# Patient Record
Sex: Female | Born: 1970 | Race: Black or African American | Hispanic: No | Smoking: Current every day smoker
Health system: Southern US, Community
[De-identification: ages and names within clinical notes are randomized; demographics above are authoritative.]

## PROBLEM LIST (undated history)

## (undated) DIAGNOSIS — E119 Type 2 diabetes mellitus without complications: Secondary | ICD-10-CM

## (undated) DIAGNOSIS — M1990 Unspecified osteoarthritis, unspecified site: Secondary | ICD-10-CM

## (undated) DIAGNOSIS — I10 Essential (primary) hypertension: Secondary | ICD-10-CM

## (undated) HISTORY — PX: ABDOMINAL HYSTERECTOMY: SHX81

---

## 1999-10-15 ENCOUNTER — Emergency Department (HOSPITAL_COMMUNITY): Admission: EM | Admit: 1999-10-15 | Discharge: 1999-10-15 | Payer: Self-pay | Admitting: *Deleted

## 2007-12-29 ENCOUNTER — Emergency Department (HOSPITAL_BASED_OUTPATIENT_CLINIC_OR_DEPARTMENT_OTHER): Admission: EM | Admit: 2007-12-29 | Discharge: 2007-12-29 | Payer: Self-pay | Admitting: Emergency Medicine

## 2010-12-12 ENCOUNTER — Emergency Department (HOSPITAL_BASED_OUTPATIENT_CLINIC_OR_DEPARTMENT_OTHER)
Admission: EM | Admit: 2010-12-12 | Discharge: 2010-12-12 | Disposition: A | Discharge status: Home / Self Care | Payer: Self-pay | Attending: Emergency Medicine | Admitting: Emergency Medicine

## 2010-12-12 ENCOUNTER — Emergency Department (INDEPENDENT_AMBULATORY_CARE_PROVIDER_SITE_OTHER): Payer: BC Managed Care – PPO

## 2010-12-12 DIAGNOSIS — R079 Chest pain, unspecified: Secondary | ICD-10-CM | POA: Insufficient documentation

## 2010-12-12 DIAGNOSIS — M4120 Other idiopathic scoliosis, site unspecified: Secondary | ICD-10-CM

## 2010-12-12 DIAGNOSIS — R1013 Epigastric pain: Secondary | ICD-10-CM

## 2010-12-12 DIAGNOSIS — D72829 Elevated white blood cell count, unspecified: Secondary | ICD-10-CM | POA: Insufficient documentation

## 2010-12-12 DIAGNOSIS — R0789 Other chest pain: Secondary | ICD-10-CM

## 2010-12-12 LAB — CK TOTAL AND CKMB (NOT AT ARMC)
CK, MB: 1 ng/mL (ref 0.3–4.0)
Relative Index: INVALID (ref 0.0–2.5)
Total CK: 81 U/L (ref 7–177)

## 2010-12-12 LAB — URINALYSIS, ROUTINE W REFLEX MICROSCOPIC
Bilirubin Urine: NEGATIVE
Glucose, UA: NEGATIVE mg/dL
Hgb urine dipstick: NEGATIVE
Ketones, ur: NEGATIVE mg/dL
Protein, ur: NEGATIVE mg/dL

## 2010-12-12 LAB — BASIC METABOLIC PANEL
Chloride: 100 mEq/L (ref 96–112)
GFR calc non Af Amer: >60 mL/min (ref >60)
Potassium: 4.1 mEq/L (ref 3.5–5.1)
Sodium: 137 mEq/L (ref 135–145)

## 2010-12-12 LAB — CBC
Platelets: 371 10*3/uL (ref 150–400)
RBC: 5.31 MIL/uL — ABNORMAL HIGH (ref 3.87–5.11)
RDW: 13.5 % (ref 11.5–15.5)
WBC: 17.1 10*3/uL — ABNORMAL HIGH (ref 4.0–10.5)

## 2010-12-12 LAB — TROPONIN I: Troponin I: <0.3 ng/mL (ref <0.30)

## 2010-12-12 LAB — DIFFERENTIAL
Basophils Absolute: 0 10*3/uL (ref 0.0–0.1)
Eosinophils Absolute: 0.2 10*3/uL (ref 0.0–0.7)
Eosinophils Relative: 1 % (ref 0–5)
Lymphocytes Relative: 19 % (ref 12–46)
Lymphs Abs: 3.3 10*3/uL (ref 0.7–4.0)
Neutrophils Relative %: 73 % (ref 43–77)

## 2010-12-12 LAB — PRO B NATRIURETIC PEPTIDE: Pro B Natriuretic peptide (BNP): 19.4 pg/mL (ref 0–125)

## 2010-12-18 ENCOUNTER — Emergency Department (INDEPENDENT_AMBULATORY_CARE_PROVIDER_SITE_OTHER): Payer: BC Managed Care – PPO

## 2010-12-18 ENCOUNTER — Emergency Department (HOSPITAL_BASED_OUTPATIENT_CLINIC_OR_DEPARTMENT_OTHER)
Admission: EM | Admit: 2010-12-18 | Discharge: 2010-12-18 | Disposition: A | Discharge status: Home / Self Care | Payer: BC Managed Care – PPO | Attending: Emergency Medicine | Admitting: Emergency Medicine

## 2010-12-18 DIAGNOSIS — R079 Chest pain, unspecified: Secondary | ICD-10-CM

## 2010-12-18 DIAGNOSIS — K219 Gastro-esophageal reflux disease without esophagitis: Secondary | ICD-10-CM | POA: Insufficient documentation

## 2010-12-18 DIAGNOSIS — D72829 Elevated white blood cell count, unspecified: Secondary | ICD-10-CM | POA: Insufficient documentation

## 2010-12-18 DIAGNOSIS — R7309 Other abnormal glucose: Secondary | ICD-10-CM | POA: Insufficient documentation

## 2010-12-18 LAB — DIFFERENTIAL
Eosinophils Absolute: 0.2 10*3/uL (ref 0.0–0.7)
Eosinophils Relative: 2 % (ref 0–5)
Lymphs Abs: 2.6 10*3/uL (ref 0.7–4.0)
Monocytes Absolute: 0.5 10*3/uL (ref 0.1–1.0)
Monocytes Relative: 4 % (ref 3–12)
Neutrophils Relative %: 72 % (ref 43–77)

## 2010-12-18 LAB — URINALYSIS, ROUTINE W REFLEX MICROSCOPIC
Glucose, UA: NEGATIVE mg/dL
Leukocytes, UA: NEGATIVE
pH: 7 (ref 5.0–8.0)

## 2010-12-18 LAB — CBC
MCH: 25.3 pg — ABNORMAL LOW (ref 26.0–34.0)
MCHC: 33.6 g/dL (ref 30.0–36.0)
MCV: 75.3 fL — ABNORMAL LOW (ref 78.0–100.0)
Platelets: 334 10*3/uL (ref 150–400)
RBC: 5.18 MIL/uL — ABNORMAL HIGH (ref 3.87–5.11)

## 2010-12-18 LAB — BASIC METABOLIC PANEL
CO2: 23 mEq/L (ref 19–32)
Chloride: 101 mEq/L (ref 96–112)
Creatinine, Ser: 0.5 mg/dL (ref 0.4–1.2)
Sodium: 136 mEq/L (ref 135–145)

## 2010-12-18 LAB — TROPONIN I: Troponin I: <0.3 ng/mL (ref <0.30)

## 2011-04-05 LAB — RAPID STREP SCREEN (MED CTR MEBANE ONLY): Streptococcus, Group A Screen (Direct): NEGATIVE

## 2011-07-04 ENCOUNTER — Emergency Department (INDEPENDENT_AMBULATORY_CARE_PROVIDER_SITE_OTHER): Payer: BC Managed Care – PPO

## 2011-07-04 ENCOUNTER — Encounter: Payer: Self-pay | Admitting: *Deleted

## 2011-07-04 ENCOUNTER — Emergency Department (HOSPITAL_BASED_OUTPATIENT_CLINIC_OR_DEPARTMENT_OTHER)
Admission: EM | Admit: 2011-07-04 | Discharge: 2011-07-04 | Disposition: A | Discharge status: Home / Self Care | Payer: BC Managed Care – PPO | Attending: Emergency Medicine | Admitting: Emergency Medicine

## 2011-07-04 DIAGNOSIS — J40 Bronchitis, not specified as acute or chronic: Secondary | ICD-10-CM | POA: Insufficient documentation

## 2011-07-04 DIAGNOSIS — Z8739 Personal history of other diseases of the musculoskeletal system and connective tissue: Secondary | ICD-10-CM | POA: Insufficient documentation

## 2011-07-04 DIAGNOSIS — R1110 Vomiting, unspecified: Secondary | ICD-10-CM | POA: Insufficient documentation

## 2011-07-04 DIAGNOSIS — F17200 Nicotine dependence, unspecified, uncomplicated: Secondary | ICD-10-CM

## 2011-07-04 DIAGNOSIS — R197 Diarrhea, unspecified: Secondary | ICD-10-CM | POA: Insufficient documentation

## 2011-07-04 DIAGNOSIS — R05 Cough: Secondary | ICD-10-CM

## 2011-07-04 HISTORY — DX: Unspecified osteoarthritis, unspecified site: M19.90

## 2011-07-04 MED ORDER — SODIUM CHLORIDE 0.9 % IV BOLUS (SEPSIS)
1000.0000 mL | Freq: Once | INTRAVENOUS | Status: AC
  Administered 2011-07-04: 1000 mL via INTRAVENOUS

## 2011-07-04 MED ORDER — ONDANSETRON HCL 4 MG PO TABS: 8.0000 mg | ORAL_TABLET | Freq: Two times a day (BID) | ORAL | Status: AC | PRN

## 2011-07-04 MED ORDER — ALBUTEROL SULFATE HFA 108 (90 BASE) MCG/ACT IN AERS
2.0000 | INHALATION_SPRAY | RESPIRATORY_TRACT | Status: DC | PRN
  Administered 2011-07-04: 2 via RESPIRATORY_TRACT
  Filled 2011-07-04: qty 6.7

## 2011-07-04 MED ORDER — ACETAMINOPHEN 500 MG PO TABS
1000.0000 mg | ORAL_TABLET | Freq: Once | ORAL | Status: AC
  Administered 2011-07-04: 1000 mg via ORAL
  Filled 2011-07-04: qty 2

## 2011-07-04 MED ORDER — HYDROCOD POLST-CHLORPHEN POLST 10-8 MG/5ML PO LQCR: 5.0000 mL | Freq: Two times a day (BID) | ORAL | Status: AC | PRN

## 2011-07-04 NOTE — ED Notes (Signed)
Pt c/o flu like SX for past month.  Pt completed Tamiflu, currently using theraflu with no relief.  Pt states N/V/D worsening today.

## 2011-07-04 NOTE — ED Provider Notes (Signed)
History     CSN: [id number]  Arrival date & time 07/04/11  1321   First MD Initiated Contact with Patient 07/04/11 1540      Chief Complaint  Patient presents with  . Influenza    (Consider location/radiation/quality/duration/timing/severity/associated sxs/prior treatment) HPI Complains of cough, nonproductive for one month . Also complains of vomiting and diarrhea onset this morning. Patient had 4 episodes diarrhea 4 episodes of vomiting. No known fever no treatment prior to coming here. Also complains of diffuse myalgias no other complaint. No other associated symptoms. Nothing makes symptoms better or worse. Cough has been unchanged no shortness of breath Past Medical History  Diagnosis Date  . Arthritis    Rheumatoid Past Surgical History  Procedure Date  . Abdominal hysterectomy     History reviewed. No pertinent family history.  History  Substance Use Topics  . Smoking status: Current Everyday Smoker -- 0.5 packs/day  . Smokeless tobacco: Not on file  . Alcohol Use: No    OB History    Grav Para Term Preterm Abortions TAB SAB Ect Mult Living                  Review of Systems  Constitutional: Positive for fatigue. Negative for fever.  HENT: Positive for postnasal drip.   Respiratory: Positive for cough.   Cardiovascular: Negative.   Gastrointestinal: Positive for vomiting and diarrhea. Negative for nausea.       No nausea at present  Musculoskeletal: Negative.   Skin: Negative.   Neurological: Negative.   Hematological: Negative.   Psychiatric/Behavioral: Negative.   All other systems reviewed and are negative.    Allergies  Penicillins  Home Medications   Current Outpatient Rx  Name Route Sig Dispense Refill  . METHOTREXATE SODIUM 10 MG PO TABS Oral Take 10 mg by mouth once a week. Caution: Chemotherapy. Protect from light.       BP 118/99  Pulse 109  Temp(Src) 98.6 F (37 C) (Oral)  Resp 18  Ht 5\' 6"  (1.676 m)  Wt 212 lb (96.163 kg)   BMI 34.22 kg/m2  SpO2 100%  Physical Exam  Nursing note and vitals reviewed. Constitutional: She appears well-developed and well-nourished. No distress.  HENT:  Head: Normocephalic and atraumatic.  Nose: Nose normal.       Mucous membranes dry  Eyes: Conjunctivae are normal. Pupils are equal, round, and reactive to light.  Neck: Neck supple. No tracheal deviation present. No thyromegaly present.  Cardiovascular: Normal rate and regular rhythm.   No murmur heard. Pulmonary/Chest: Effort normal and breath sounds normal.       Occasional cough  Abdominal: Soft. Bowel sounds are normal. She exhibits no distension. There is no tenderness.  Musculoskeletal: Normal range of motion. She exhibits no edema and no tenderness.  Neurological: She is alert. Coordination normal.  Skin: Skin is warm and dry. No rash noted.  Psychiatric: She has a normal mood and affect.    ED Course  Procedures (including critical care time) 5:45 PM feels improved after treatment with albuterol HFA and intravenous fluids Labs Reviewed - No data to display No results found. Results for orders placed during the hospital encounter of 12/18/10  DIFFERENTIAL      Component Value Range   Neutrophils Relative 72  43 - 77 (%)   Neutro Abs 8.9 (*) 1.7 - 7.7 (K/uL)   Lymphocytes Relative 22  12 - 46 (%)   Lymphs Abs 2.6  0.7 - 4.0 (K/uL)  Monocytes Relative 4  3 - 12 (%)   Monocytes Absolute 0.5  0.1 - 1.0 (K/uL)   Eosinophils Relative 2  0 - 5 (%)   Eosinophils Absolute 0.2  0.0 - 0.7 (K/uL)   Basophils Relative 0  0 - 1 (%)   Basophils Absolute 0.0  0.0 - 0.1 (K/uL)  CBC      Component Value Range   WBC 12.3 (*) 4.0 - 10.5 (K/uL)   RBC 5.18 (*) 3.87 - 5.11 (MIL/uL)   Hemoglobin 13.1  12.0 - 15.0 (g/dL)   HCT 45.4  09.8 - 11.9 (%)   MCV 75.3 (*) 78.0 - 100.0 (fL)   MCH 25.3 (*) 26.0 - 34.0 (pg)   MCHC 33.6  30.0 - 36.0 (g/dL)   RDW 14.7  82.9 - 56.2 (%)   Platelets 334  150 - 400 (K/uL)  CK TOTAL AND CKMB       Component Value Range   Total CK 101  7 - 177 (U/L)   CK, MB 1.1  0.3 - 4.0 (ng/mL)   Relative Index 1.1  0.0 - 2.5   TROPONIN I      Component Value Range   Troponin I <0.30  <0.30 (ng/mL)  BASIC METABOLIC PANEL      Component Value Range   Sodium 136  135 - 145 (mEq/L)   Potassium 3.7  3.5 - 5.1 (mEq/L)   Chloride 101  96 - 112 (mEq/L)   CO2 23  19 - 32 (mEq/L)   Glucose, Bld 181 (*) 70 - 99 (mg/dL)   BUN 7  6 - 23 (mg/dL)   Creatinine, Ser 1.30  0.4 - 1.2 (mg/dL)   Calcium 9.4  8.4 - 86.5 (mg/dL)   GFR calc non Af Amer >60  >60 (mL/min)   GFR calc Af Amer >60  >60 (mL/min)  URINALYSIS, ROUTINE W REFLEX MICROSCOPIC      Component Value Range   Color, Urine YELLOW  YELLOW    APPearance CLEAR  CLEAR    Specific Gravity, Urine 1.022  1.005 - 1.030    pH 7.0  5.0 - 8.0    Glucose, UA NEGATIVE  NEGATIVE (mg/dL)   Hgb urine dipstick NEGATIVE  NEGATIVE    Bilirubin Urine NEGATIVE  NEGATIVE    Ketones, ur NEGATIVE  NEGATIVE (mg/dL)   Protein, ur NEGATIVE  NEGATIVE (mg/dL)   Urobilinogen, UA 4.0 (*) 0.0 - 1.0 (mg/dL)   Nitrite NEGATIVE  NEGATIVE    Leukocytes, UA NEGATIVE  NEGATIVE    Dg Chest 2 View  07/04/2011  *RADIOLOGY REPORT*  Clinical Data: Cough, smoker  CHEST - 2 VIEW  Comparison: December 18, 2010  Findings: The cardiac silhouette, mediastinum, pulmonary vasculature are within normal limits.  Both lungs are clear. There is no acute bony abnormality.  IMPRESSION: There is no evidence of acute cardiac or pulmonary process.  Original Report Authenticated By: Brandon Melnick, M.D.     No diagnosis found.    MDM  Suspect bronchitis is caused cough for past month Vomiting diarrhea likely secondary to viral illness Plan albuterol HFA to go 2 puffs every 4 hours when necessary shortness of breath or cough Encourage by mouth fluid Prescription Tussionex, Zofran Followup PMD as needed Nicole Kindred #1 bronchitis #2 gastroenteritis with mild dehydration        Doug Sou, MD 07/04/11 1750

## 2011-07-04 NOTE — ED Notes (Signed)
Pt c/o flu like symptoms x 1 month

## 2012-05-16 IMAGING — CR DG CHEST 2V
2 series · 2 of 2 positions shown · non-contrast
Comparison: None.

CLINICAL DATA: 39-year-old female with left side chest pain
radiating to the left upper extremity.

CHEST - 2 VIEW

[w chest pa]
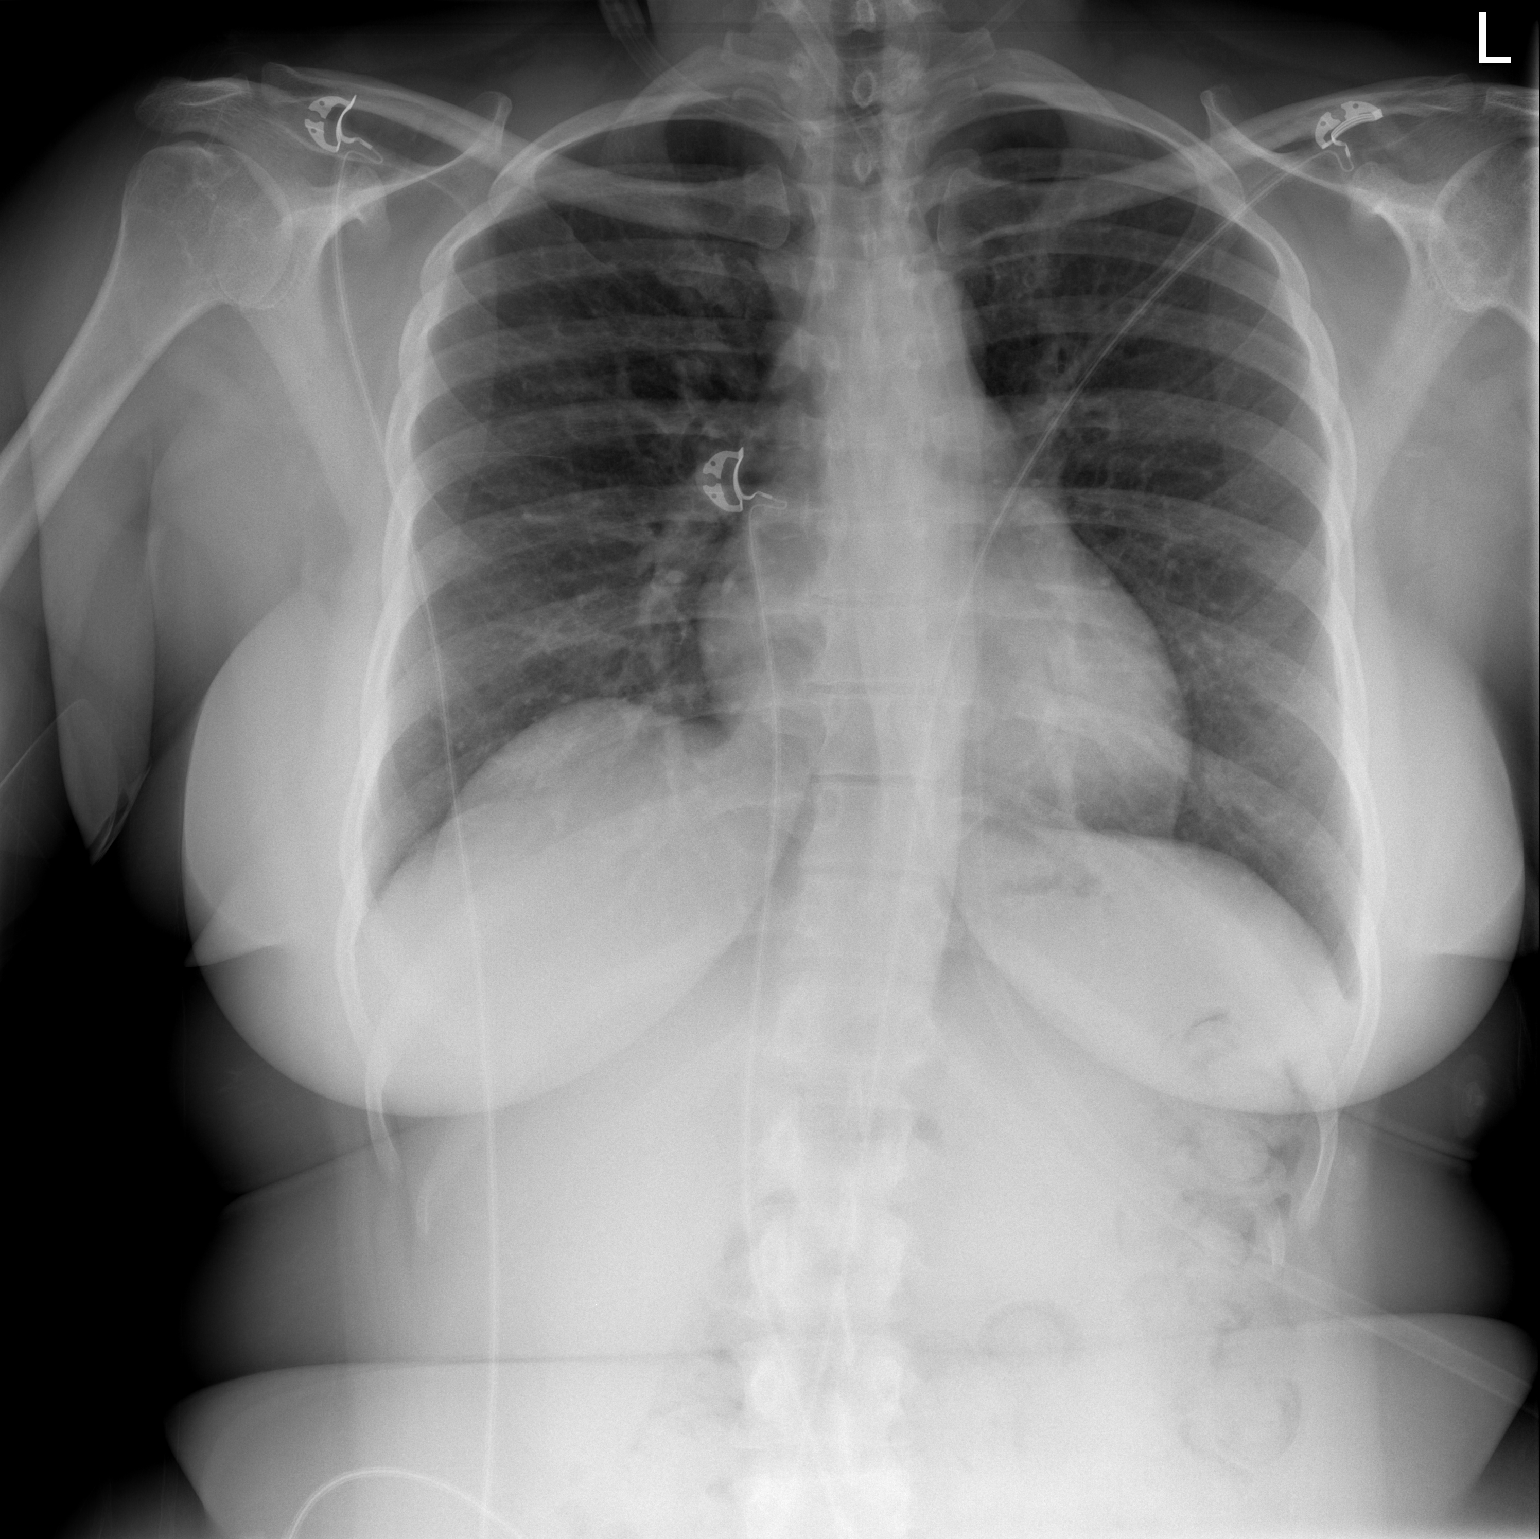

[w chest lat]
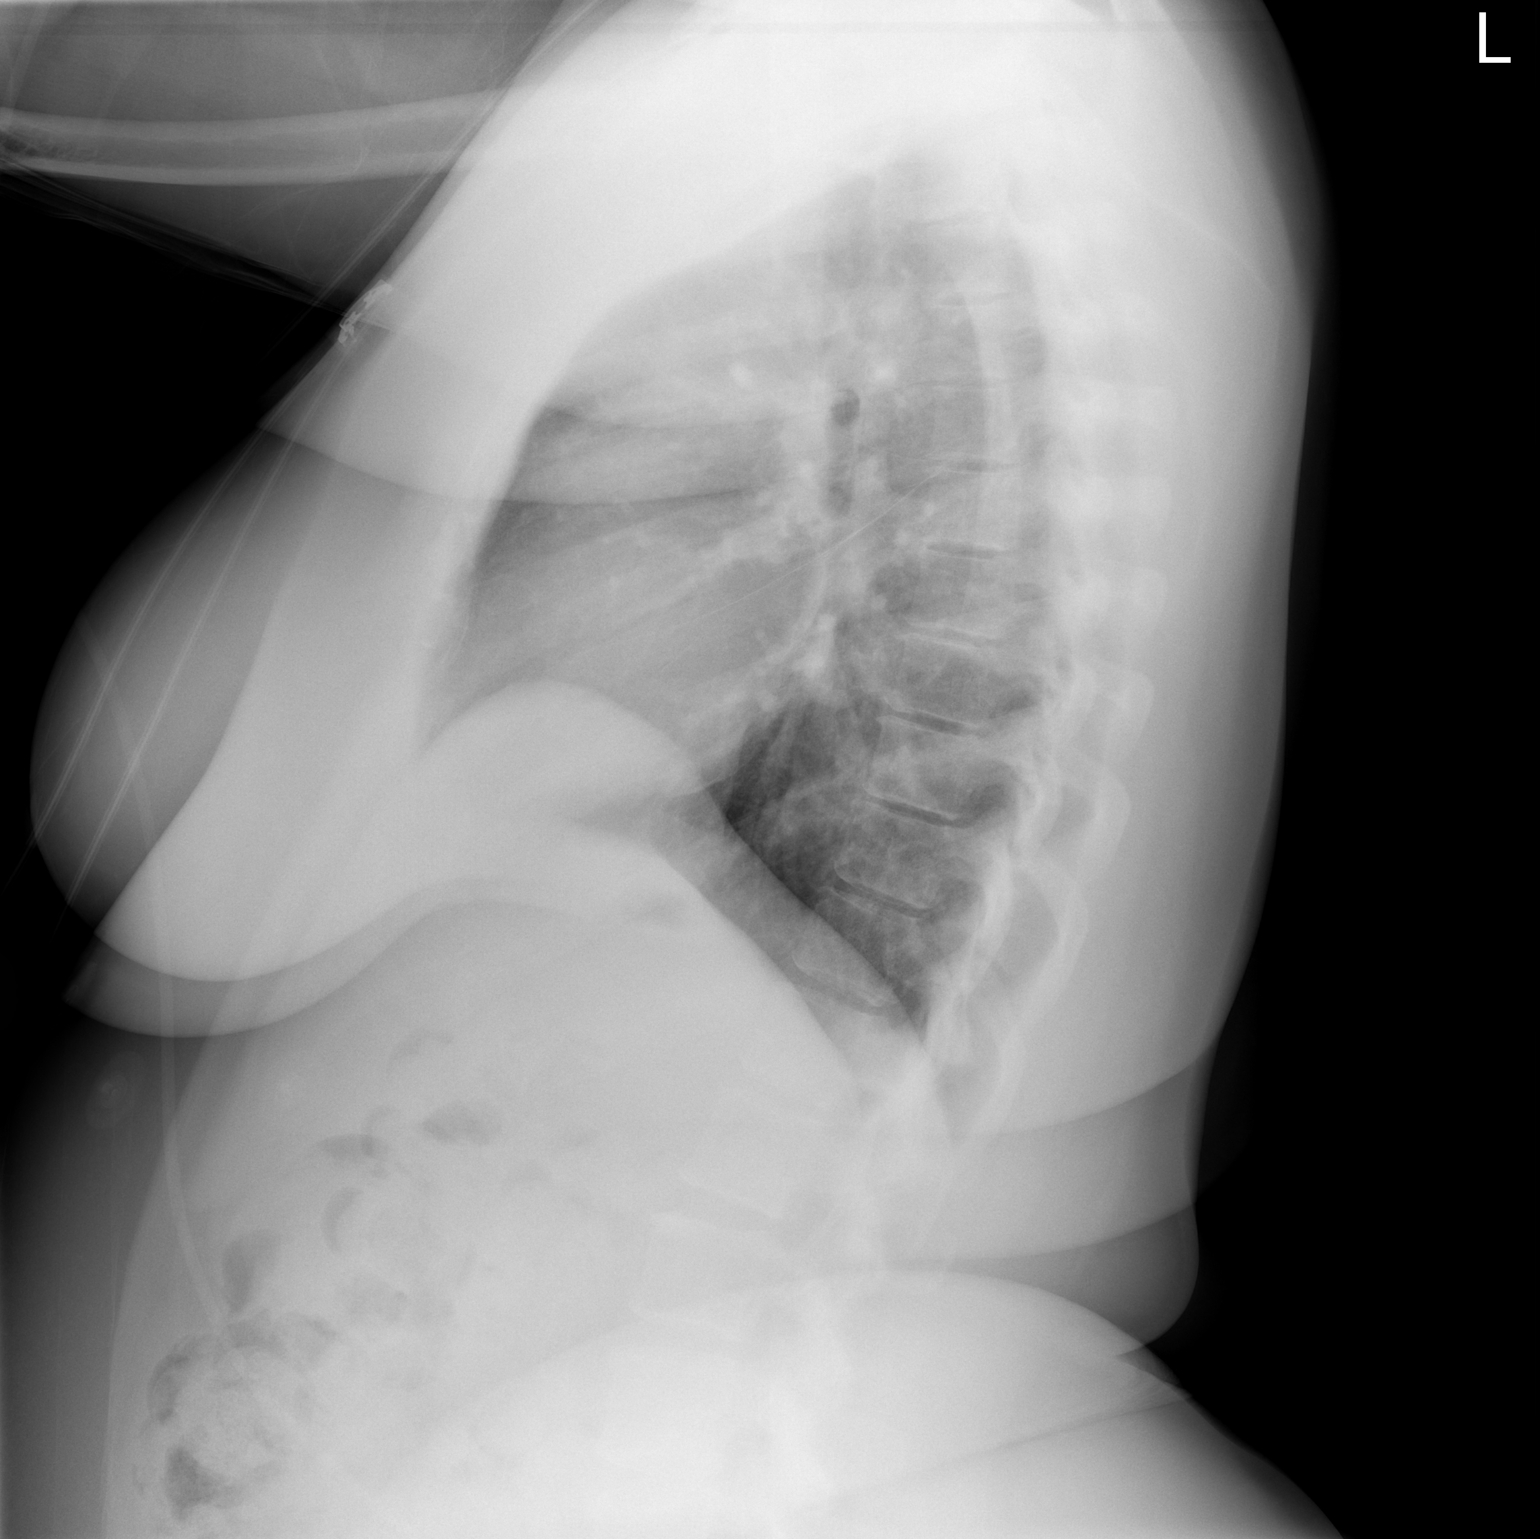

[2 of 2 positions shown; findings below may reference images not displayed]

FINDINGS: Mild scoliosis.  Cardiac size and mediastinal contours
are within normal limits.  Visualized tracheal air column is within
normal limits.  Normal lung volumes with mild elevation of the
right hemidiaphragm.  The lungs are clear.  No pneumothorax or
effusion. No acute osseous abnormality identified.
IMPRESSION: Negative, no acute cardiopulmonary abnormality.

## 2012-05-16 IMAGING — US US ABDOMEN COMPLETE
1 series · 14 of 25 positions shown · non-contrast
Comparison: None

CLINICAL DATA: Epigastric abdominal pain.

COMPLETE ABDOMINAL ULTRASOUND

[Series 1: us abdomen complete · 0.32mm/px · 14 of 69 slices shown]
[im 1/69]
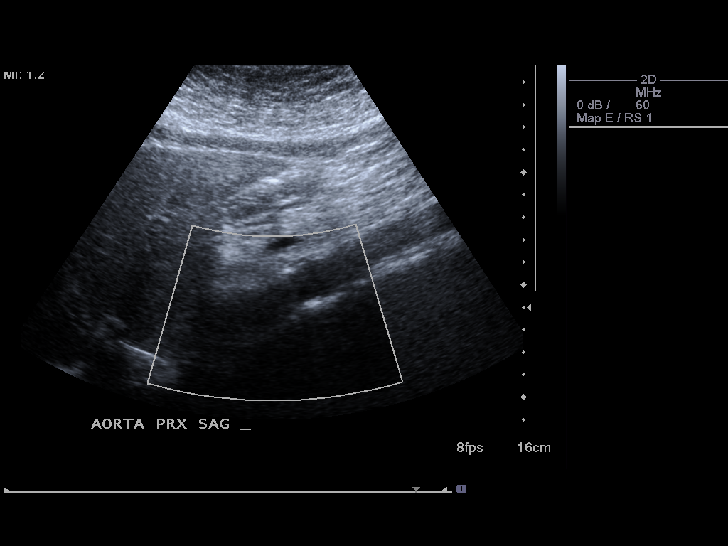
[im 6/69]
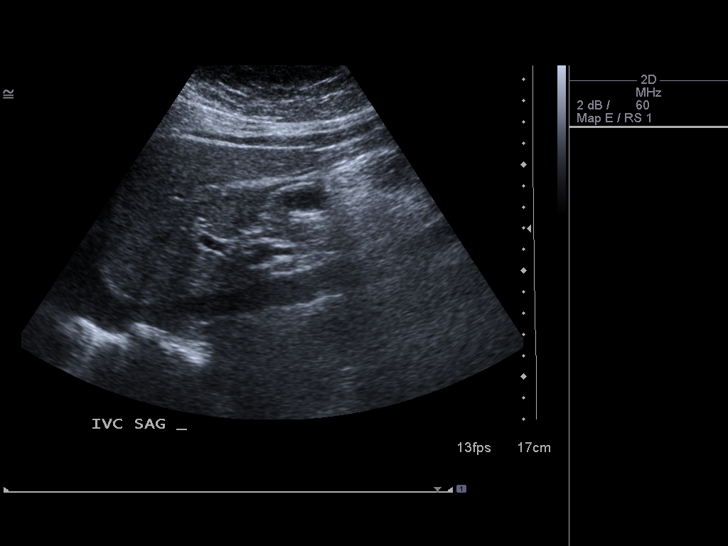
[im 12/69]
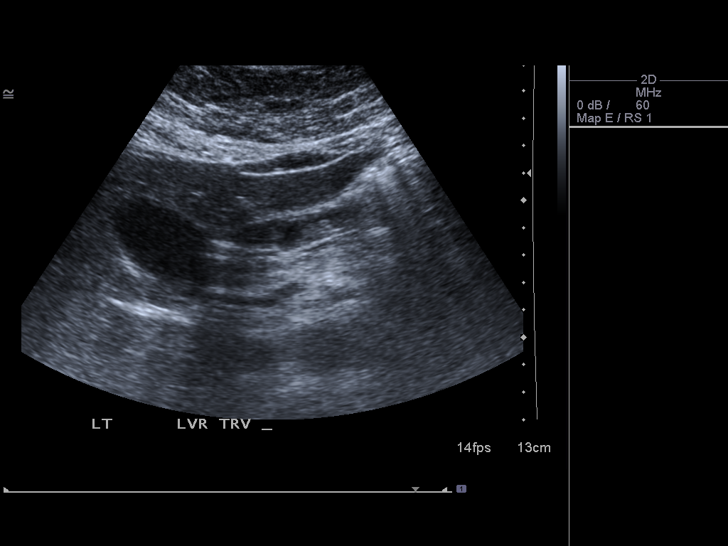
[im 18/69]
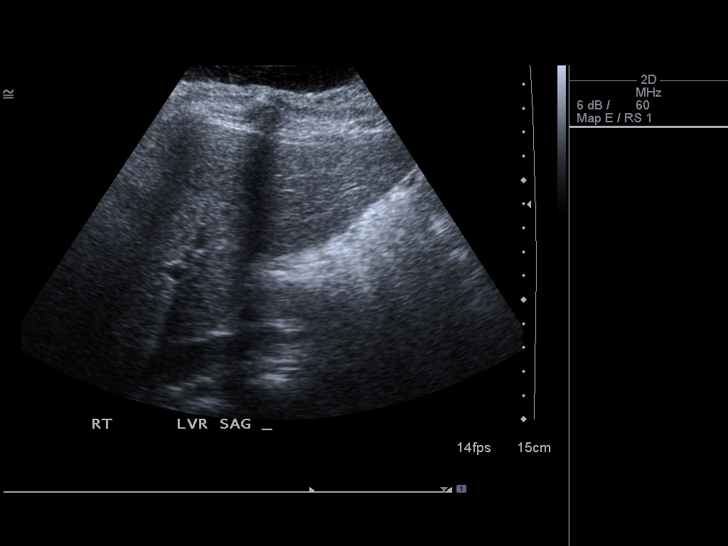
[im 23/69]
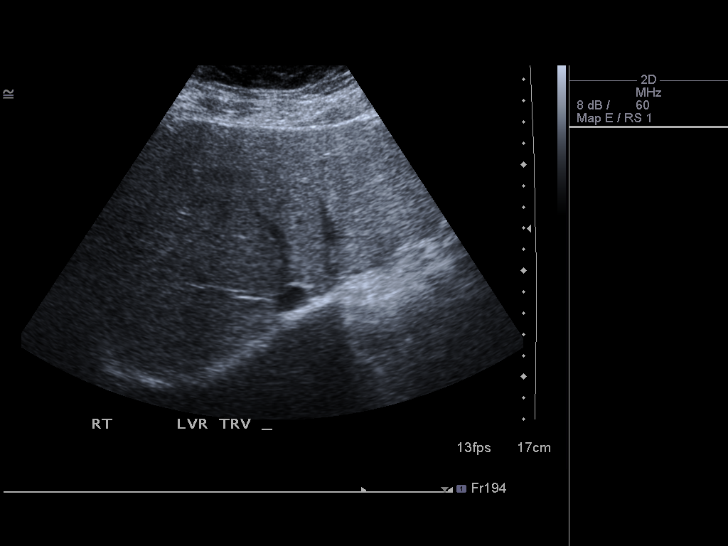
[im 26/69]
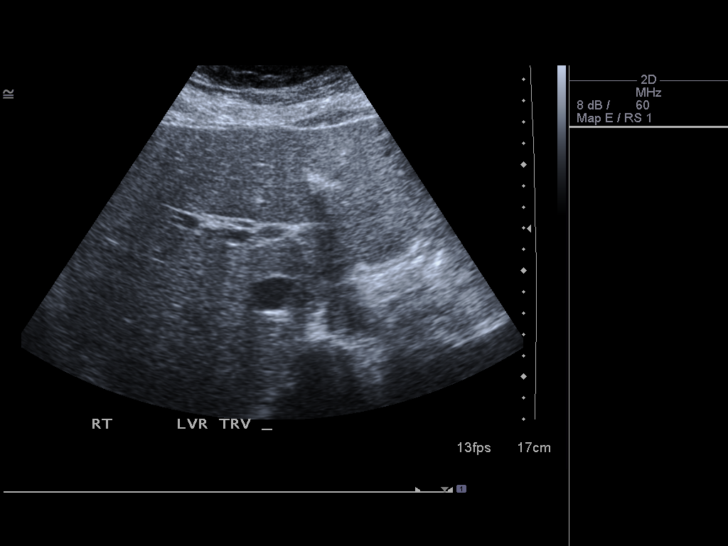
[im 32/69]
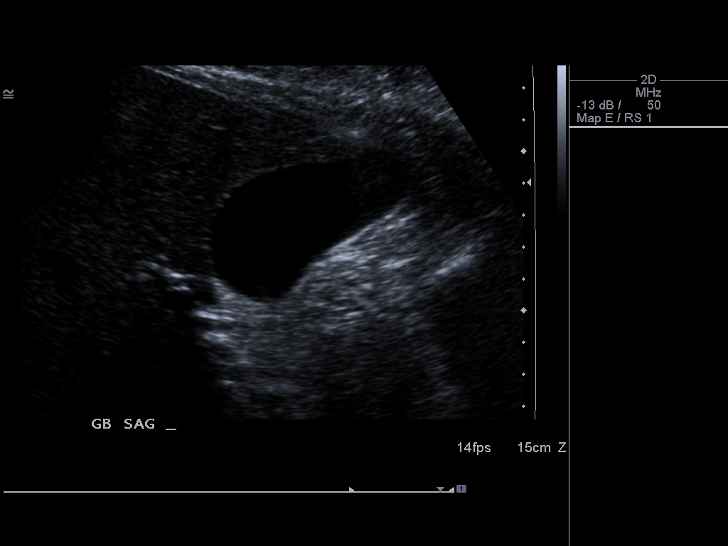
[im 37/69]
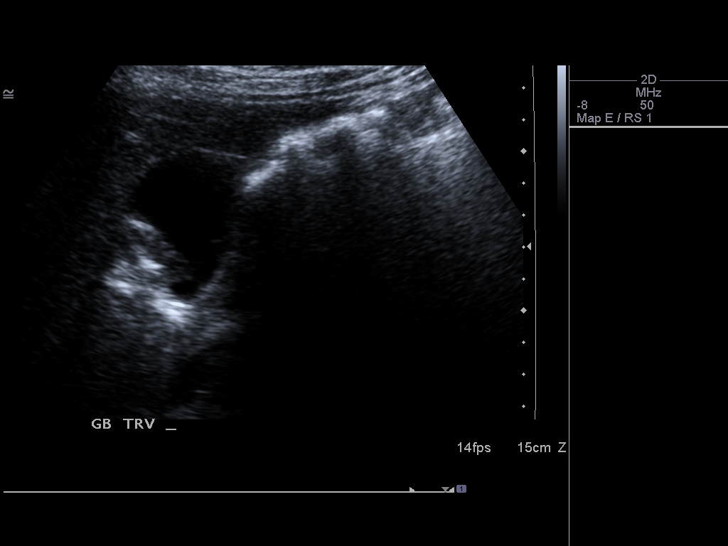
[im 43/69]
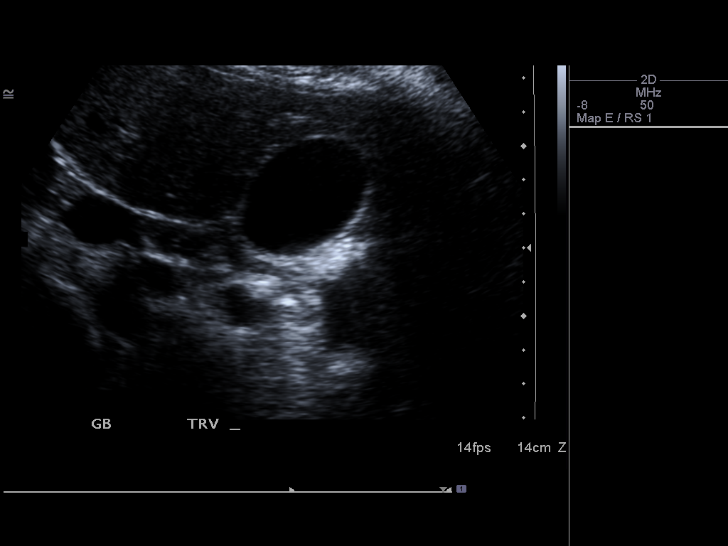
[im 46/69]
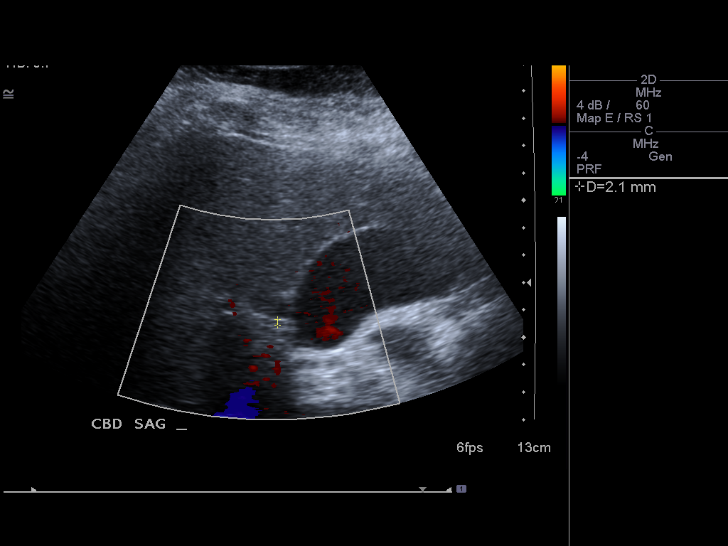
[im 52/69]
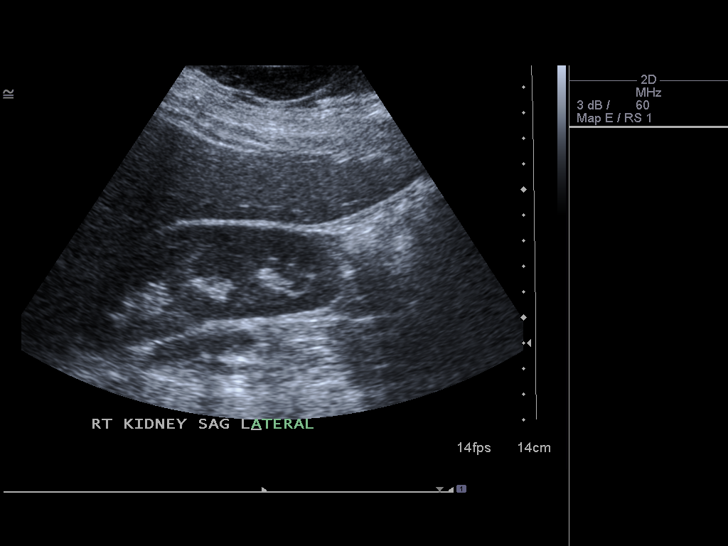
[im 57/69]
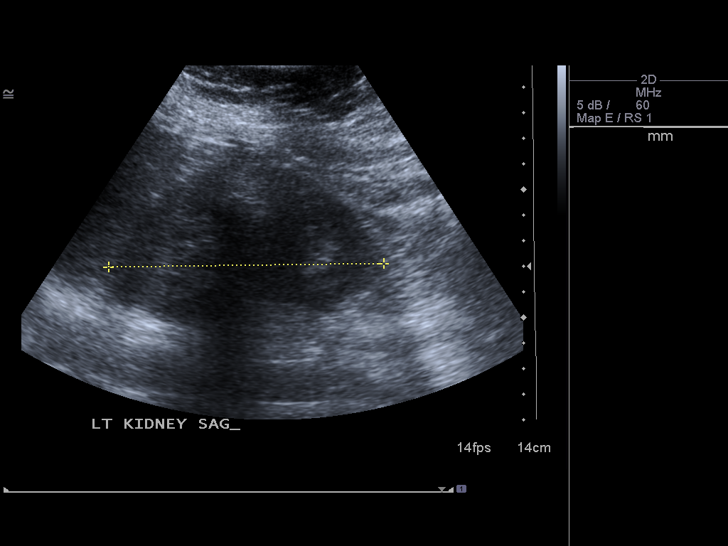
[im 63/69]
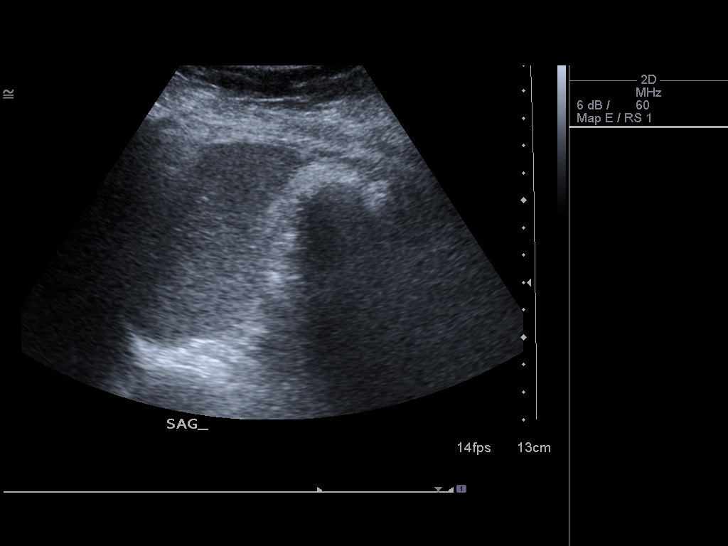
[im 69/69]
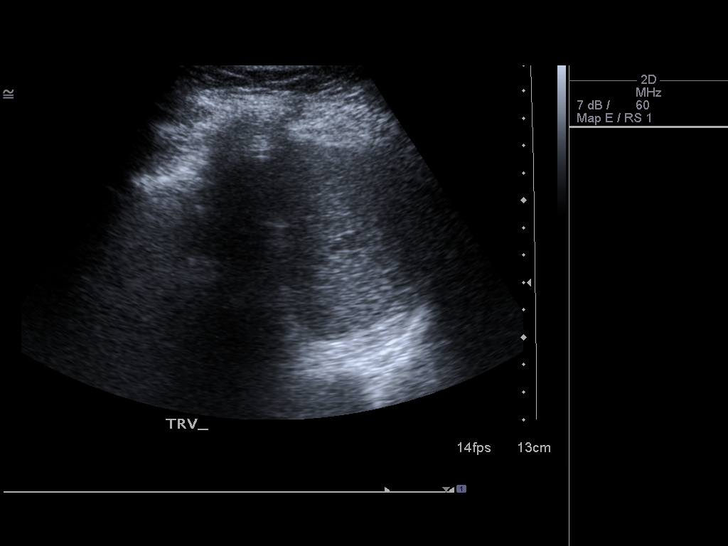

[14 of 25 positions shown; findings below may reference images not displayed]

FINDINGS: Gallbladder:  No gallstones, gallbladder wall thickening, or
pericholecystic fluid.

Common bile duct:  Normal in caliber measuring a maximum of 2.1mm.

Liver:  The liver is sonographically unremarkable.  There is normal
echogenicity without focal lesions or intrahepatic biliary
dilatation.

IVC:  Normal caliber.

Pancreas:  Limited visualization of the pancreas due to overlying
bowel gas.  The body region of the pancreas appears normal.  The
head and tail are not well seen.

Spleen:  Normal in size and echogenicity without focal lesions.

Right Kidney:  10.8 cm in length. Normal renal cortical thickness
and echogenicity without focal lesions or hydronephrosis.

Left Kidney:  10.8 cm in length. Normal renal cortical thickness
and echogenicity without focal lesions or hydronephrosis.

Abdominal aorta:  Normal caliber.
IMPRESSION: Unremarkable abdominal ultrasound examination.  Limited evaluation
of the pancreas.

## 2012-05-22 IMAGING — CR DG CHEST 2V
2 series · 2 of 2 positions shown · non-contrast
Comparison: 12/12/2010

CLINICAL DATA: Chest pain

CHEST - 2 VIEW

[w chest pa]
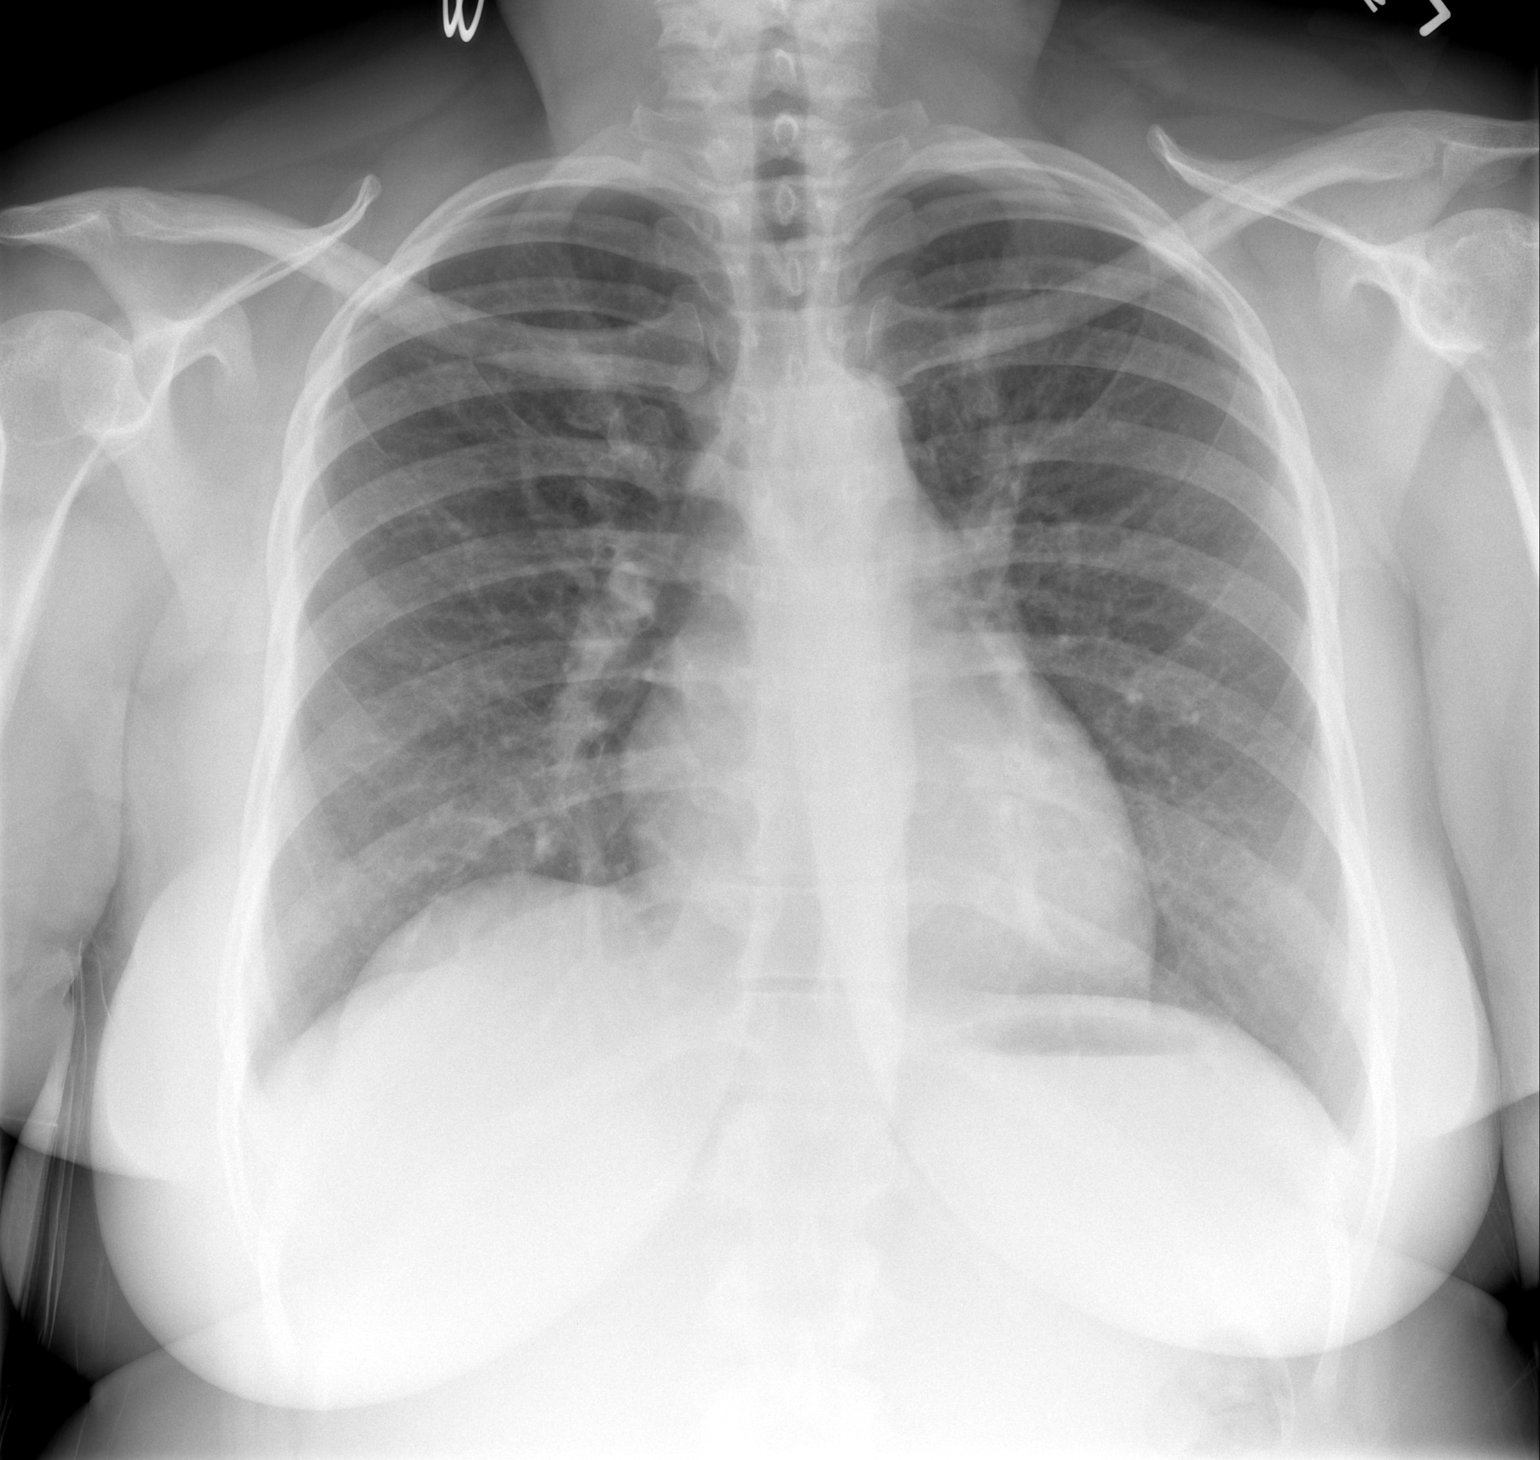

[w chest lat]
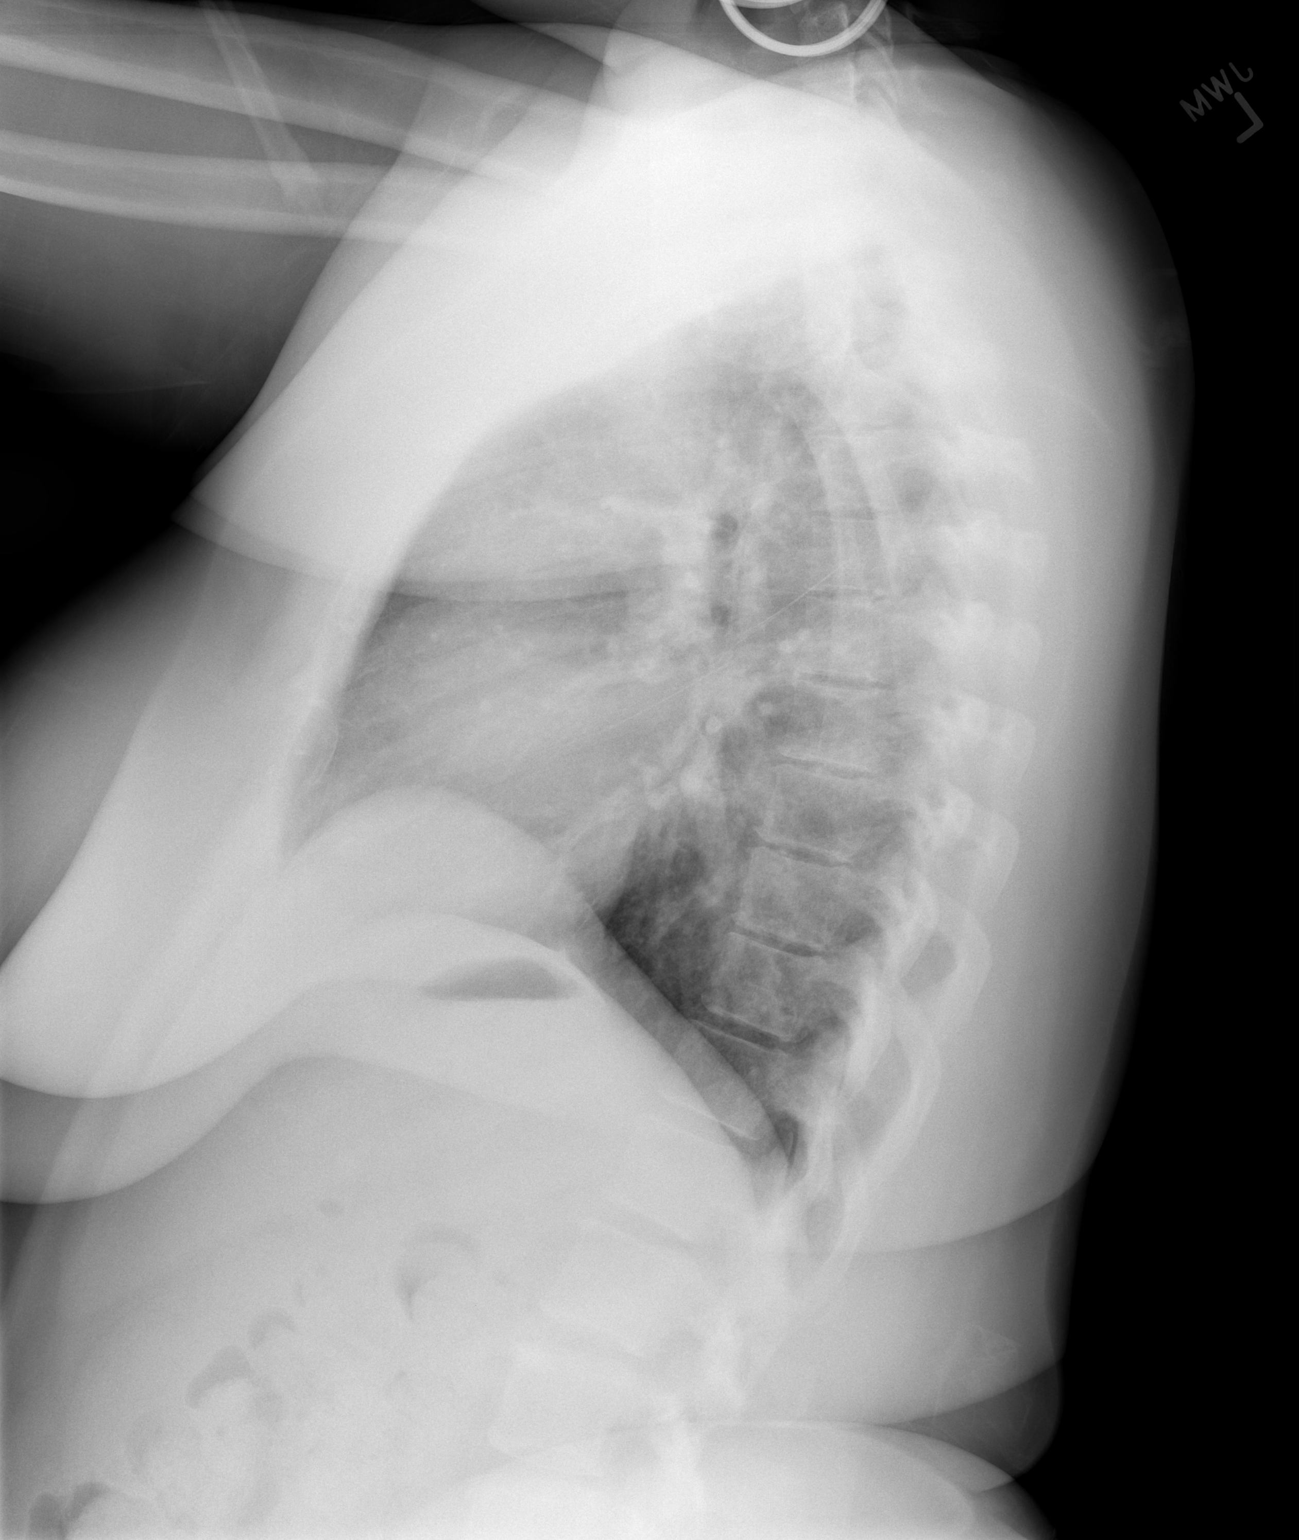

[2 of 2 positions shown; findings below may reference images not displayed]

FINDINGS: Heart and mediastinal contours normal.  Lungs clear.  No
pleural fluid.  Osseous structures and soft tissues unremarkable.
IMPRESSION: No acute or significant findings.

## 2013-12-08 ENCOUNTER — Other Ambulatory Visit: Payer: Self-pay | Admitting: Physical Medicine and Rehabilitation

## 2013-12-08 ENCOUNTER — Ambulatory Visit
Admission: RE | Admit: 2013-12-08 | Discharge: 2013-12-08 | Disposition: A | Discharge status: Home / Self Care | Payer: No Typology Code available for payment source | Source: Ambulatory Visit | Attending: Physical Medicine and Rehabilitation | Admitting: Physical Medicine and Rehabilitation

## 2013-12-08 ENCOUNTER — Encounter (INDEPENDENT_AMBULATORY_CARE_PROVIDER_SITE_OTHER): Payer: Self-pay

## 2013-12-08 ENCOUNTER — Ambulatory Visit: Payer: Self-pay

## 2013-12-08 DIAGNOSIS — Z0000 Encounter for general adult medical examination without abnormal findings: Secondary | ICD-10-CM

## 2015-03-30 ENCOUNTER — Other Ambulatory Visit: Payer: Self-pay

## 2015-03-30 DIAGNOSIS — Z1231 Encounter for screening mammogram for malignant neoplasm of breast: Secondary | ICD-10-CM

## 2015-04-18 ENCOUNTER — Ambulatory Visit
Admission: RE | Admit: 2015-04-18 | Discharge: 2015-04-18 | Disposition: A | Discharge status: Home / Self Care | Payer: Managed Care, Other (non HMO) | Source: Ambulatory Visit

## 2015-04-18 DIAGNOSIS — Z1231 Encounter for screening mammogram for malignant neoplasm of breast: Secondary | ICD-10-CM

## 2017-03-26 ENCOUNTER — Encounter (HOSPITAL_COMMUNITY): Payer: Self-pay | Admitting: Emergency Medicine

## 2017-03-26 ENCOUNTER — Ambulatory Visit (HOSPITAL_COMMUNITY)
Admission: EM | Admit: 2017-03-26 | Discharge: 2017-03-26 | Disposition: A | Discharge status: Home / Self Care | Payer: PRIVATE HEALTH INSURANCE | Attending: Internal Medicine | Admitting: Internal Medicine

## 2017-03-26 DIAGNOSIS — J019 Acute sinusitis, unspecified: Secondary | ICD-10-CM

## 2017-03-26 HISTORY — DX: Type 2 diabetes mellitus without complications: E11.9

## 2017-03-26 MED ORDER — CLINDAMYCIN HCL 300 MG PO CAPS: 300.0000 mg | ORAL_CAPSULE | Freq: Three times a day (TID) | ORAL | 0 refills | Status: AC

## 2017-03-26 MED ORDER — METHYLPREDNISOLONE SODIUM SUCC 125 MG IJ SOLR
125.0000 mg | Freq: Once | INTRAMUSCULAR | Status: AC
  Administered 2017-03-26: 125 mg via INTRAMUSCULAR

## 2017-03-26 MED ORDER — METHYLPREDNISOLONE SODIUM SUCC 125 MG IJ SOLR
INTRAMUSCULAR | Status: AC
  Filled 2017-03-26: qty 2

## 2017-03-26 NOTE — ED Provider Notes (Signed)
[facility]    CSN: [id number] Arrival date & time: 03/26/17  1926     History   Chief Complaint Chief Complaint  Patient presents with  . URI    HPI Mallory Carroll is a 46 y.o. female.  She has been having difficulty with sinus congestion, nasal discharge, headache, for about the last month. She had a course of Levaquin, prescribed by video visit, which helped only while she was taking the medicine.  Does have malaise. No fever. History of rheumatoid arthritis, on [place]rav[place].     HPI  Past Medical History:  Diagnosis Date  . Arthritis   . Diabetes mellitus without complication [facility])     Past Surgical History:  Procedure Laterality Date  . ABDOMINAL HYSTERECTOMY      Home Medications    Prior to Admission medications   Medication Sig Start Date End Date Taking? Authorizing Provider  metFORMIN (GLUCOPHAGE) 500 MG tablet Take by mouth 2 (two) times daily with a meal.   Yes [provider]  chlorpheniramine-HYDROcodone (TUSSIONEX PENNKINETIC ER) 10-8 MG/5ML LQCR Take 5 mLs by mouth every 12 (twelve) hours as needed. 07/04/11   Doug Sou, MD  clindamycin (CLEOCIN) 300 MG capsule Take 1 capsule (300 mg total) by mouth 3 (three) times daily. 03/26/17 04/05/17  Eustace Moore, MD  methotrexate (RHEUMATREX) 10 MG tablet Take 10 mg by mouth once a week. Caution: Chemotherapy. Protect from light.     [provider]    Family History No family history on file.  Social History Social History  Substance Use Topics  . Smoking status: Current Every Day Smoker    Packs/day: 0.50    Types: Cigarettes  . Smokeless tobacco: Never Used  . Alcohol use No     Allergies   Penicillins   Review of Systems Review of Systems  All other systems reviewed and are negative.    Physical Exam Triage Vital Signs ED Triage Vitals  Enc Vitals Group     BP 03/26/17 2013 (!) 146/94     Pulse Rate 03/26/17 2013 92     Resp 03/26/17 2013 16     Temp  03/26/17 2013 98.5 F (36.9 C)     Temp Source 03/26/17 2013 Oral     SpO2 03/26/17 2013 100 %     Weight 03/26/17 2011 190 lb (86.2 kg)     Height 03/26/17 2011  (1.676 m)     Pain Score 03/26/17 2012 8     Pain Loc --    Updated Vital Signs BP (!) 146/94   Pulse 92   Temp 98.5 F (36.9 C) (Oral)   Resp 16   Ht  (1.676 m)   Wt 190 lb (86.2 kg)   SpO2 100%   BMI 30.67 kg/m   Physical Exam  Constitutional: She is oriented to person, place, and time.  Looks tired  HENT:  Head: Atraumatic.  Bilateral TMs are opaque, red tinged Bilateral marked nasal mucosal congestion, essentially occluded Throat is red  Eyes:  Conjugate gaze observed, no eye redness/discharge  Neck: Neck supple.  Cardiovascular: Normal rate and regular rhythm.   Pulmonary/Chest: No respiratory distress. She has no wheezes. She has no rales.  Lungs clear, symmetric breath sounds  Abdominal: She exhibits no distension.  Musculoskeletal: Normal range of motion.  Neurological: She is alert and oriented to person, place, and time.  Skin: Skin is warm and dry.  Nursing note and vitals reviewed.  UC Treatments / Results   Procedures Procedures (including critical care time)  Medications Ordered in UC Medications  methylPREDNISolone sodium succinate (SOLU-MEDROL) 125 mg/2 mL injection 125 mg (125 mg Intramuscular Given 03/26/17 2052)    Final Clinical Impressions(s) / UC Diagnoses   Final diagnoses:  Acute sinusitis with symptoms > 10 days   Anticipate gradual improvement in congestion/headache over the next several days.  Recheck for new fever >100.5, increasing phlegm production/nasal discharge, or if not starting to improve in a few days.  Injection of solumedrol (steroid) was given at the urgent care.  Prescription for clindamycin (antibiotic) was sent to the pharmacy.     New Prescriptions Discharge Medication List as of 03/26/2017  8:43 PM    START taking these medications    Details  clindamycin (CLEOCIN) 300 MG capsule Take 1 capsule (300 mg total) by mouth 3 (three) times daily., Starting Tue 03/26/2017, Until Fri 04/05/2017, Normal         Controlled Substance Prescriptions [place] Controlled Substance Registry consulted? NA   Eustace Moore, MD 03/27/17 1459

## 2017-03-26 NOTE — Discharge Instructions (Addendum)
Anticipate gradual improvement in congestion/headache over the next several days.  Recheck for new fever >100.5, increasing phlegm production/nasal discharge, or if not starting to improve in a few days.  Injection of solumedrol (steroid) was given at the urgent care.  Prescription for clindamycin (antibiotic) was sent to the pharmacy.

## 2017-03-26 NOTE — ED Triage Notes (Signed)
PT reports she took 10 days course of antibiotics for same symptoms in late august. PT improved for 2 days. For last two weeks, PT reports congestion, sore throat, facial pain and pressure, cough.

## 2018-07-17 DIAGNOSIS — Z0001 Encounter for general adult medical examination with abnormal findings: Secondary | ICD-10-CM | POA: Diagnosis not present

## 2018-07-17 DIAGNOSIS — M25512 Pain in left shoulder: Secondary | ICD-10-CM | POA: Diagnosis not present

## 2018-07-17 DIAGNOSIS — Z23 Encounter for immunization: Secondary | ICD-10-CM | POA: Diagnosis not present

## 2018-07-17 DIAGNOSIS — I10 Essential (primary) hypertension: Secondary | ICD-10-CM | POA: Diagnosis not present

## 2018-07-17 DIAGNOSIS — E785 Hyperlipidemia, unspecified: Secondary | ICD-10-CM | POA: Diagnosis not present

## 2018-07-17 DIAGNOSIS — Z0000 Encounter for general adult medical examination without abnormal findings: Secondary | ICD-10-CM | POA: Diagnosis not present

## 2018-07-17 DIAGNOSIS — E1169 Type 2 diabetes mellitus with other specified complication: Secondary | ICD-10-CM | POA: Diagnosis not present

## 2018-07-28 DIAGNOSIS — Z803 Family history of malignant neoplasm of breast: Secondary | ICD-10-CM | POA: Diagnosis not present

## 2018-07-28 DIAGNOSIS — Z1231 Encounter for screening mammogram for malignant neoplasm of breast: Secondary | ICD-10-CM | POA: Diagnosis not present

## 2018-08-07 DIAGNOSIS — R922 Inconclusive mammogram: Secondary | ICD-10-CM | POA: Diagnosis not present

## 2018-08-07 DIAGNOSIS — N6002 Solitary cyst of left breast: Secondary | ICD-10-CM | POA: Diagnosis not present

## 2018-08-20 DIAGNOSIS — J301 Allergic rhinitis due to pollen: Secondary | ICD-10-CM | POA: Diagnosis not present

## 2018-08-20 DIAGNOSIS — N898 Other specified noninflammatory disorders of vagina: Secondary | ICD-10-CM | POA: Diagnosis not present

## 2018-09-16 DIAGNOSIS — R609 Edema, unspecified: Secondary | ICD-10-CM | POA: Diagnosis not present

## 2018-09-29 DIAGNOSIS — J45909 Unspecified asthma, uncomplicated: Secondary | ICD-10-CM | POA: Diagnosis not present

## 2018-09-29 DIAGNOSIS — Z03818 Encounter for observation for suspected exposure to other biological agents ruled out: Secondary | ICD-10-CM | POA: Diagnosis not present

## 2018-12-23 DIAGNOSIS — R112 Nausea with vomiting, unspecified: Secondary | ICD-10-CM | POA: Diagnosis not present

## 2018-12-23 DIAGNOSIS — J029 Acute pharyngitis, unspecified: Secondary | ICD-10-CM | POA: Diagnosis not present

## 2018-12-24 DIAGNOSIS — R112 Nausea with vomiting, unspecified: Secondary | ICD-10-CM | POA: Diagnosis not present

## 2018-12-24 DIAGNOSIS — J029 Acute pharyngitis, unspecified: Secondary | ICD-10-CM | POA: Diagnosis not present

## 2019-01-30 DIAGNOSIS — M2550 Pain in unspecified joint: Secondary | ICD-10-CM | POA: Diagnosis not present

## 2019-01-30 DIAGNOSIS — R600 Localized edema: Secondary | ICD-10-CM | POA: Diagnosis not present

## 2019-01-30 DIAGNOSIS — I10 Essential (primary) hypertension: Secondary | ICD-10-CM | POA: Diagnosis not present

## 2019-01-30 DIAGNOSIS — E1169 Type 2 diabetes mellitus with other specified complication: Secondary | ICD-10-CM | POA: Diagnosis not present

## 2019-02-09 DIAGNOSIS — M25571 Pain in right ankle and joints of right foot: Secondary | ICD-10-CM | POA: Diagnosis not present

## 2019-02-09 DIAGNOSIS — M130 Polyarthritis, unspecified: Secondary | ICD-10-CM | POA: Diagnosis not present

## 2019-02-09 DIAGNOSIS — M79643 Pain in unspecified hand: Secondary | ICD-10-CM | POA: Diagnosis not present

## 2019-02-09 DIAGNOSIS — M25473 Effusion, unspecified ankle: Secondary | ICD-10-CM | POA: Diagnosis not present

## 2019-02-09 DIAGNOSIS — M7989 Other specified soft tissue disorders: Secondary | ICD-10-CM | POA: Diagnosis not present

## 2019-02-09 DIAGNOSIS — M069 Rheumatoid arthritis, unspecified: Secondary | ICD-10-CM | POA: Diagnosis not present

## 2019-02-09 DIAGNOSIS — M79642 Pain in left hand: Secondary | ICD-10-CM | POA: Diagnosis not present

## 2019-02-09 DIAGNOSIS — M25572 Pain in left ankle and joints of left foot: Secondary | ICD-10-CM | POA: Diagnosis not present

## 2019-02-09 DIAGNOSIS — Z23 Encounter for immunization: Secondary | ICD-10-CM | POA: Diagnosis not present

## 2019-02-09 DIAGNOSIS — M79671 Pain in right foot: Secondary | ICD-10-CM | POA: Diagnosis not present

## 2019-02-09 DIAGNOSIS — M79641 Pain in right hand: Secondary | ICD-10-CM | POA: Diagnosis not present

## 2019-03-02 DIAGNOSIS — M25473 Effusion, unspecified ankle: Secondary | ICD-10-CM | POA: Diagnosis not present

## 2019-03-02 DIAGNOSIS — M069 Rheumatoid arthritis, unspecified: Secondary | ICD-10-CM | POA: Diagnosis not present

## 2019-03-02 DIAGNOSIS — M79643 Pain in unspecified hand: Secondary | ICD-10-CM | POA: Diagnosis not present

## 2019-03-02 DIAGNOSIS — M130 Polyarthritis, unspecified: Secondary | ICD-10-CM | POA: Diagnosis not present

## 2019-03-20 DIAGNOSIS — Z79899 Other long term (current) drug therapy: Secondary | ICD-10-CM | POA: Diagnosis not present

## 2019-03-20 DIAGNOSIS — E1169 Type 2 diabetes mellitus with other specified complication: Secondary | ICD-10-CM | POA: Diagnosis not present

## 2019-05-04 DIAGNOSIS — M069 Rheumatoid arthritis, unspecified: Secondary | ICD-10-CM | POA: Diagnosis not present

## 2019-05-04 DIAGNOSIS — M25473 Effusion, unspecified ankle: Secondary | ICD-10-CM | POA: Diagnosis not present

## 2019-05-04 DIAGNOSIS — M130 Polyarthritis, unspecified: Secondary | ICD-10-CM | POA: Diagnosis not present

## 2019-05-04 DIAGNOSIS — E119 Type 2 diabetes mellitus without complications: Secondary | ICD-10-CM | POA: Diagnosis not present

## 2019-12-02 ENCOUNTER — Other Ambulatory Visit: Payer: Self-pay

## 2019-12-02 ENCOUNTER — Emergency Department (HOSPITAL_COMMUNITY)
Admission: EM | Admit: 2019-12-02 | Discharge: 2019-12-03 | Disposition: A | Discharge status: Home / Self Care | Payer: Managed Care, Other (non HMO) | Attending: Emergency Medicine | Admitting: Emergency Medicine

## 2019-12-02 ENCOUNTER — Emergency Department (HOSPITAL_COMMUNITY): Payer: Managed Care, Other (non HMO)

## 2019-12-02 ENCOUNTER — Encounter (HOSPITAL_COMMUNITY): Payer: Self-pay | Admitting: *Deleted

## 2019-12-02 DIAGNOSIS — R079 Chest pain, unspecified: Secondary | ICD-10-CM | POA: Diagnosis present

## 2019-12-02 DIAGNOSIS — Z5321 Procedure and treatment not carried out due to patient leaving prior to being seen by health care provider: Secondary | ICD-10-CM | POA: Diagnosis not present

## 2019-12-02 HISTORY — DX: Essential (primary) hypertension: I10

## 2019-12-02 LAB — I-STAT BETA HCG BLOOD, ED (MC, WL, AP ONLY): I-stat hCG, quantitative: <5 m[IU]/mL (ref <5)

## 2019-12-02 MED ORDER — SODIUM CHLORIDE 0.9% FLUSH: 3.0000 mL | Freq: Once | INTRAVENOUS | Status: DC

## 2019-12-02 NOTE — ED Triage Notes (Signed)
Pt says around 2030 tonight she had onset of central chest pain that radiates to the right side and up her neck. Does have SOB and nausea.

## 2019-12-03 LAB — BASIC METABOLIC PANEL
Anion gap: 9 (ref 5–15)
BUN: 9 mg/dL (ref 6–20)
CO2: 25 mmol/L (ref 22–32)
Calcium: 9.6 mg/dL (ref 8.9–10.3)
Chloride: 105 mmol/L (ref 98–111)
Creatinine, Ser: 0.75 mg/dL (ref 0.44–1.00)
GFR calc Af Amer: >60 mL/min (ref >60)
GFR calc non Af Amer: >60 mL/min (ref >60)
Glucose, Bld: 128 mg/dL — ABNORMAL HIGH (ref 70–99)
Potassium: 4.7 mmol/L (ref 3.5–5.1)
Sodium: 139 mmol/L (ref 135–145)

## 2019-12-03 LAB — CBC
HCT: 43.3 % (ref 36.0–46.0)
Hemoglobin: 13.7 g/dL (ref 12.0–15.0)
MCH: 25.5 pg — ABNORMAL LOW (ref 26.0–34.0)
MCHC: 31.6 g/dL (ref 30.0–36.0)
MCV: 80.5 fL (ref 80.0–100.0)
Platelets: 372 10*3/uL (ref 150–400)
RBC: 5.38 MIL/uL — ABNORMAL HIGH (ref 3.87–5.11)
RDW: 15.1 % (ref 11.5–15.5)
WBC: 18.5 10*3/uL — ABNORMAL HIGH (ref 4.0–10.5)
nRBC: 0 % (ref 0.0–0.2)

## 2019-12-03 LAB — TROPONIN I (HIGH SENSITIVITY)
Troponin I (High Sensitivity): 8 ng/L (ref <18)
Troponin I (High Sensitivity): 7 ng/L (ref <18)

## 2019-12-03 NOTE — ED Notes (Signed)
Patient seen leaving ED with steady gait, nad.

## 2019-12-03 NOTE — ED Notes (Signed)
Patient states she is going to [facility] hospital because she is tired of playing the numbers game.

## 2024-04-01 ENCOUNTER — Encounter: Payer: Self-pay | Admitting: Internal Medicine

## 2024-04-02 ENCOUNTER — Other Ambulatory Visit: Payer: Self-pay | Admitting: Internal Medicine

## 2024-04-02 DIAGNOSIS — R0981 Nasal congestion: Secondary | ICD-10-CM

## 2024-04-10 ENCOUNTER — Ambulatory Visit
Admission: RE | Admit: 2024-04-10 | Discharge: 2024-04-10 | Disposition: A | Discharge status: Home / Self Care | Source: Ambulatory Visit | Attending: Internal Medicine | Admitting: Internal Medicine

## 2024-04-10 DIAGNOSIS — R0981 Nasal congestion: Secondary | ICD-10-CM

## 2024-05-05 ENCOUNTER — Encounter (INDEPENDENT_AMBULATORY_CARE_PROVIDER_SITE_OTHER): Payer: Self-pay | Admitting: Otolaryngology

## 2024-05-05 ENCOUNTER — Ambulatory Visit (INDEPENDENT_AMBULATORY_CARE_PROVIDER_SITE_OTHER): Admitting: Otolaryngology

## 2024-05-05 VITALS — BP 134/88 | HR 92 | Ht 66.0 in | Wt 170.0 lb

## 2024-05-05 DIAGNOSIS — R0981 Nasal congestion: Secondary | ICD-10-CM | POA: Diagnosis not present

## 2024-05-05 DIAGNOSIS — J328 Other chronic sinusitis: Secondary | ICD-10-CM | POA: Diagnosis not present

## 2024-05-05 DIAGNOSIS — J3489 Other specified disorders of nose and nasal sinuses: Secondary | ICD-10-CM | POA: Diagnosis not present

## 2024-05-05 DIAGNOSIS — J342 Deviated nasal septum: Secondary | ICD-10-CM

## 2024-05-05 DIAGNOSIS — Z87891 Personal history of nicotine dependence: Secondary | ICD-10-CM

## 2024-05-05 DIAGNOSIS — J343 Hypertrophy of nasal turbinates: Secondary | ICD-10-CM | POA: Diagnosis not present

## 2024-05-05 NOTE — Progress Notes (Signed)
 Dear Dr. Rexanne, Here is my assessment for our mutual patient, Mallory Carroll. Thank you for allowing me the opportunity to care for your patient. Please do not hesitate to contact me should you have any other questions. Sincerely, Dr. Eldora Blanch  Otolaryngology Clinic Note  HISTORY: Initial visit (04/2024) Discussed the use of AI scribe software for clinical note transcription with the patient, who gave verbal consent to proceed.  History of Present Illness REIGAN TOLLIVER is a 53 year old female who presents with persistent sinus congestion and discomfort.  She has a history of frequent sinus infections, with symptoms re-emerging in May, including sinus congestion (max pressure, comfort and pain) and intermittent discolored drainage. Over-the-counter medications were ineffective, and a nasal spray (unclear which, flonase?) actually worsened her symptoms. She was prescribed multiple antibiotics, including doxycycline, levofloxacin, azithromycin, and clindamycin  (currently finishing it), and a course of prednisone, with limited success -- clindamycin  seemed to help her the best. She's been on 6 rounds of abx and 1 round of steroids from then until now. Pain when wearing glasses persists due to facial tenderness, although ear and tooth pain have resolved.  A CT scan was performed, and she continues to use a neti pot and Navage daily with distilled water, avoiding nasal sprays.  No changes in vision, double vision, or typical allergy symptoms such as itching, sneezing, or runny eyes.   No previous sinonasal surgery.  She is currently using navage, finishing antibiotics.  GLP-1: yes AP/AC: no  Tobacco: former, quit  PMHx: RA, HTN, DM, Leukocytosis (chronic)  RADIOGRAPHIC EVALUATION AND INDEPENDENT REVIEW OF OTHER RECORDS:: Dr. Rexanne Referral notes reviewed and uploaded or available in chart in media tab (03/31/2024): noted recurrent sinus infection and congestion Labs 10/01/2023  CMP: BUN/Cr 10/0.7, Alk Phos elevated to 175; CBC 03/25/2024: WBC 15.0, Eos 200 CT Face 04/10/2024: left septal deviation with silent sinus left > right; some MPT; noted bilateral concha bullosa; left sphenoid MPT with small sphenoid Past Medical History:  Diagnosis Date   Arthritis    Diabetes mellitus without complication (HCC)    Hypertension    Past Surgical History:  Procedure Laterality Date   ABDOMINAL HYSTERECTOMY     History reviewed. No pertinent family history. 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 50 mL 0   metFORMIN (GLUCOPHAGE) 500 MG tablet Take by mouth 2 (two) times daily with a meal.     methotrexate (RHEUMATREX) 10 MG tablet Take 10 mg by mouth once a week. Caution: Chemotherapy. Protect from light.      No current facility-administered medications for this visit.   BP 134/88 (BP Location: Left Arm, Patient Position: Sitting, Cuff Size: Large)   Pulse 92   Ht 5' 6 (1.676 m)   Wt 170  lb (77.1 kg)   SpO2 98%   BMI 27.44 kg/m   PHYSICAL EXAM:  BP 134/88 (BP Location: Left Arm, Patient Position: Sitting, Cuff Size: Large)   Pulse 92   Ht 5' 6 (1.676 m)   Wt 170 lb (77.1 kg)   SpO2 98%   BMI 27.44 kg/m    Salient findings:  CN II-XII intact Bilateral EAC clear  and TM intact with well pneumatized middle ear spaces No hypoglobus or enophthalmos Nose: Anterior rhinoscopy reveals septum deviation left, bilateral inferior turbinate hypertrophy.  Nasal endoscopy was indicated to better evaluate the nose and paranasal sinuses, given the patient's history and exam findings, and is detailed below. No lesions of oral cavity/oropharynx No obviously palpable neck masses/lymphadenopathy/thyromegaly No respiratory distress or stridor   PROCEDURE:  Prior to initiating any procedures, risks/benefits/alternatives were explained to the patient and verbal consent obtained. Diagnostic Nasal Endoscopy Pre-procedure diagnosis: Concern for chronic sinusitis Post-procedure diagnosis: same Indication: See pre-procedure diagnosis and physical exam above Complications: None apparent EBL: 0 mL Anesthesia: Lidocaine 4% and topical decongestant was topically sprayed in each nasal cavity  Description of Procedure:  Patient was identified. A rigid 30 degree endoscope was utilized to evaluate the sinonasal cavities, mucosa, sinus ostia and turbinates and septum.  Overall, signs of mucosal inflammation are noted as below.  Also noted are bilateral atelectactic uncinates clearly with modest mucosal edema  No mucopurulence, polyps, or masses noted.   Right Middle meatus: no purulence Right SE Recess: no purulence Left MM: no purulence Left SE Recess: no purulence Photodocumentation was obtained.  CPT CODE -- 31231 - Mod 25   ASSESSMENT:  53 y.o. with RA now with::  1. Silent sinus syndrome   2. Other chronic sinusitis   3. Hypertrophy of both inferior nasal turbinates   4. Nasal congestion   5. Nasal septal deviation   6. Nasal obstruction    Has been on 6 rounds of abx per her since this summer and round of steroids and daily navage with persistent [facility] area discomfort. CT reviewed - does have some opacification b/l max and findings suggestive of silent sinus.  Given  lack of improvement, we discussed options including FESS and turbinate reduction We discussed the goals of sinus surgery, and expectations for postoperative management. We discussed R/B/A including pain, infection, bleeding (~2% risk of operative visit for control), persistent symptoms (~20% chance), need for revision surgery, and other risks including damage to the eye (<1%), and injury to skull base, anesthetic complications, among others.    Patient understands and given persistent sx, is ready to proceed.  PLAN: We've discussed issues and options today.  We reviewed the nasal endoscopy images together.  The risks, benefits and alternatives were discussed and questions answered.  She has elected to proceed with:  - Continue daily navage - Will schedule for b/l FESS, turb reduction - f/u POD 7; will need to hold rheum meds for 1 week prior - 1 week preop phone visit  See below regarding exact medications prescribed this encounter including dosages and route: No orders of the defined types were placed in this encounter.    Thank you for allowing me the opportunity to care for your patient. Please do not hesitate to contact me should you have any other questions.  Sincerely, Eldora Blanch, MD Otolaryngologist (ENT), [facility] Phone: [phone] Fax: [phone]  MDM:  Level 4: [phone] Complexity/Problems addressed: mod Data complexity: mod - independent interpretation of CT; review of notes, labs -  Morbidity: mod - decision for surgery  - Prescription Drug prescribed or managed: n  05/05/2024, 12:16 PM

## 2024-05-05 NOTE — Patient Instructions (Signed)
Silent sinus syndrome

## 2024-08-13 ENCOUNTER — Ambulatory Visit (INDEPENDENT_AMBULATORY_CARE_PROVIDER_SITE_OTHER): Admitting: Otolaryngology

## 2024-08-27 ENCOUNTER — Ambulatory Visit (INDEPENDENT_AMBULATORY_CARE_PROVIDER_SITE_OTHER): Admitting: Otolaryngology

## 2024-11-19 ENCOUNTER — Ambulatory Visit (INDEPENDENT_AMBULATORY_CARE_PROVIDER_SITE_OTHER): Admitting: Otolaryngology

## 2024-12-03 ENCOUNTER — Ambulatory Visit (INDEPENDENT_AMBULATORY_CARE_PROVIDER_SITE_OTHER): Admitting: Otolaryngology
